# Patient Record
Sex: Female | Born: 1995 | Hispanic: No | Marital: Single | Smoking: Never smoker
Health system: Southern US, Community
[De-identification: ages and names within clinical notes are randomized; demographics above are authoritative.]

## PROBLEM LIST (undated history)

## (undated) HISTORY — PX: WISDOM TOOTH EXTRACTION: SHX21

---
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
## 2017-02-16 DIAGNOSIS — J069 Acute upper respiratory infection, unspecified: Secondary | ICD-10-CM | POA: Insufficient documentation

## 2017-02-16 DIAGNOSIS — R067 Sneezing: Secondary | ICD-10-CM | POA: Insufficient documentation

## 2017-02-16 DIAGNOSIS — R0981 Nasal congestion: Secondary | ICD-10-CM | POA: Insufficient documentation

## 2017-02-16 DIAGNOSIS — B9789 Other viral agents as the cause of diseases classified elsewhere: Secondary | ICD-10-CM | POA: Insufficient documentation

## 2017-02-16 DIAGNOSIS — J3489 Other specified disorders of nose and nasal sinuses: Secondary | ICD-10-CM | POA: Insufficient documentation

## 2017-02-16 DIAGNOSIS — J029 Acute pharyngitis, unspecified: Secondary | ICD-10-CM | POA: Insufficient documentation

## 2017-02-16 DIAGNOSIS — Z79899 Other long term (current) drug therapy: Secondary | ICD-10-CM | POA: Insufficient documentation

## 2017-02-17 ENCOUNTER — Emergency Department (HOSPITAL_COMMUNITY)
Admission: EM | Admit: 2017-02-17 | Discharge: 2017-02-17 | Disposition: A | Discharge status: Home / Self Care | Payer: Self-pay | Attending: Emergency Medicine | Admitting: Emergency Medicine

## 2017-02-17 ENCOUNTER — Encounter (HOSPITAL_COMMUNITY): Payer: Self-pay | Admitting: *Deleted

## 2017-02-17 ENCOUNTER — Other Ambulatory Visit: Payer: Self-pay

## 2017-02-17 ENCOUNTER — Emergency Department (HOSPITAL_COMMUNITY): Payer: Self-pay

## 2017-02-17 DIAGNOSIS — J069 Acute upper respiratory infection, unspecified: Secondary | ICD-10-CM

## 2017-02-17 DIAGNOSIS — B9789 Other viral agents as the cause of diseases classified elsewhere: Secondary | ICD-10-CM

## 2017-02-17 NOTE — ED Triage Notes (Signed)
Pt c/o nasal congestion with some wheezing and sob that started x 2 days ago;

## 2017-02-17 NOTE — ED Provider Notes (Signed)
[facility] EMERGENCY DEPARTMENT Provider Note   CSN: [id number] Arrival date & time: 02/16/17  2345     History   Chief Complaint Chief Complaint  Patient presents with  . URI    HPI Sophia Robinson is a 22 y.o. female.  The history is provided by the patient.  URI   This is a new problem. The current episode started 2 days ago. The problem has been gradually worsening. There has been no fever. Associated symptoms include congestion, sinus pain, sneezing, sore throat and cough. Pertinent negatives include no chest pain and no vomiting. Treatments tried: OTC meds. The treatment provided no relief.    History reviewed. No pertinent past medical history.  Patient Active Problem List   Diagnosis Date Noted  . Borderline blood pressure 05/31/2016  . Encounter for initial prescription of contraceptive pills 05/31/2016    Past Surgical History:  Procedure Laterality Date  . WISDOM TOOTH EXTRACTION      OB History    No data available       Home Medications    Prior to Admission medications   Medication Sig Start Date End Date Taking? Authorizing Provider  Multiple Vitamin (MULTIVITAMIN WITH MINERALS) TABS tablet Take 1 tablet by mouth daily.    [provider]  Norethindrone-Ethinyl Estradiol-Fe Biphas (LO LOESTRIN FE) 1 MG-10 MCG / 10 MCG tablet Take 1 tablet by mouth daily. 11/15/16   Jacklyn Shell, CNM    Family History Family History  Problem Relation Age of Onset  . Bipolar disorder Maternal Grandmother   . Cataracts Maternal Grandmother   . Cataracts Maternal Grandfather   . Diabetes Father   . Breast cancer Cousin     Social History Social History   Tobacco Use  . Smoking status: Never Smoker  . Smokeless tobacco: Never Used  Substance Use Topics  . Alcohol use: No  . Drug use: No     Allergies   Patient has no known allergies.   Review of Systems Review of Systems  HENT: Positive for congestion, sinus pain,  sneezing and sore throat.   Respiratory: Positive for cough.   Cardiovascular: Negative for chest pain.  Gastrointestinal: Negative for vomiting.  All other systems reviewed and are negative.    Physical Exam Updated Vital Signs BP 121/74 (BP Location: Right Arm)   Pulse 74   Temp 98.9 F (37.2 C) (Oral)   Resp 17   Ht 1.753 m (5\' 9" )   Wt 61.2 kg (135 lb)   LMP 02/12/2017   SpO2 100%   BMI 19.94 kg/m   Physical Exam CONSTITUTIONAL: Well developed/well nourished HEAD: Normocephalic/atraumatic EYES: EOMI ENMT: Mucous membranes moist, nasal congestion noted NECK: supple no meningeal signs SPINE/BACK:entire spine nontender CV: S1/S2 noted, no murmurs/rubs/gallops noted LUNGS: Lungs are clear to auscultation bilaterally, no apparent distress ABDOMEN: soft, nontender NEURO: Pt is awake/alert/appropriate, moves all extremitiesx4.  No facial droop.   EXTREMITIES: pulses normal/equal, full ROM SKIN: warm, color normal PSYCH: no abnormalities of mood noted, alert and oriented to situation   ED Treatments / Results  Labs (all labs ordered are listed, but only abnormal results are displayed) Labs Reviewed - No data to display  EKG  EKG Interpretation None       Radiology Dg Chest 2 View  Result Date: 02/17/2017 CLINICAL DATA:  Nonproductive cough, wheezing and shortness of breath for 2 days. EXAM: CHEST  2 VIEW COMPARISON:  None. FINDINGS: The heart size and mediastinal contours are within normal limits.  Both lungs are clear. The visualized skeletal structures are unremarkable. IMPRESSION: Normal chest. Electronically Signed   By: Awilda Metroourtnay  Bloomer M.D.   On: 02/17/2017 01:01    Procedures Procedures (including critical care time)  Medications Ordered in ED Medications - No data to display   Initial Impression / Assessment and Plan / ED Course  I have reviewed the triage vital signs and the nursing notes.  Pertinent imaging results that were available during my  care of the patient were reviewed by me and considered in my medical decision making (see chart for details).       Final Clinical Impressions(s) / ED Diagnoses   Final diagnoses:  Viral URI with cough    ED Discharge Orders    None       Zadie RhineWickline, Rastus Borton, MD 02/17/17 [phone]

## 2017-07-29 ENCOUNTER — Encounter: Payer: Self-pay | Admitting: Advanced Practice Midwife

## 2017-07-30 ENCOUNTER — Encounter: Payer: Self-pay | Admitting: Advanced Practice Midwife

## 2019-02-20 DIAGNOSIS — Z113 Encounter for screening for infections with a predominantly sexual mode of transmission: Secondary | ICD-10-CM | POA: Diagnosis not present

## 2019-02-20 DIAGNOSIS — Z6827 Body mass index (BMI) 27.0-27.9, adult: Secondary | ICD-10-CM | POA: Diagnosis not present

## 2019-02-20 DIAGNOSIS — Z01419 Encounter for gynecological examination (general) (routine) without abnormal findings: Secondary | ICD-10-CM | POA: Diagnosis not present

## 2019-03-20 DIAGNOSIS — F331 Major depressive disorder, recurrent, moderate: Secondary | ICD-10-CM | POA: Diagnosis not present

## 2019-03-20 DIAGNOSIS — Z1331 Encounter for screening for depression: Secondary | ICD-10-CM | POA: Diagnosis not present

## 2019-03-20 DIAGNOSIS — R030 Elevated blood-pressure reading, without diagnosis of hypertension: Secondary | ICD-10-CM | POA: Diagnosis not present

## 2019-03-20 DIAGNOSIS — R222 Localized swelling, mass and lump, trunk: Secondary | ICD-10-CM | POA: Diagnosis not present

## 2019-04-03 DIAGNOSIS — F4323 Adjustment disorder with mixed anxiety and depressed mood: Secondary | ICD-10-CM | POA: Diagnosis not present

## 2019-04-07 ENCOUNTER — Ambulatory Visit: Payer: Self-pay | Admitting: Surgery

## 2019-04-07 DIAGNOSIS — L723 Sebaceous cyst: Secondary | ICD-10-CM | POA: Diagnosis not present

## 2019-04-07 NOTE — H&P (Signed)
History of Present Illness Sophia Robinson. Sophia Heinemann MD; 04/07/2019 10:09 AM) The patient is a 24 year old female who presents with a complaint of Mass. This is a patient referred by Dr. Cristie Hem at [facility] Reason for consultation is a mass on the lower back This is a healthy 24 year old female who presents with several months of a palpable mass in the left lumbar region and the subcutaneous tissues. Recently became quite large and inflamed. It was very tender. She denies any rupture or drainage from this area. This slowly resolved over time. She still has a tiny bit of tenderness and it still remains easily palpable. However the pain is much better. She is referred to [place] to discuss excision.   Problem List/Past Medical Sophia Key K. Garlan Drewes, MD; 04/07/2019 10:09 AM) Sophia Robinson CYST (L72.3)  Past Surgical History Sophia Lorenzo, LPN; 04/15/7671 4:19 AM) Oral Surgery  Diagnostic Studies History Sophia Lorenzo, LPN; 04/13/9022 0:97 AM) Colonoscopy never Mammogram never Pap Smear 1-5 years ago  Allergies Sophia Lorenzo, LPN; 04/10/3297 2:42 AM) No Known Drug Allergies [04/07/2019]:  Medication History Sophia Lorenzo, LPN; 07/14/3417 6:22 AM) Sertraline HCl (100MG  Tablet, Oral) Active. Multivitamin (Oral) Active. Medications Reconciled  Social History Sophia Lorenzo, LPN; 03/18/7987 2:11 AM) Alcohol use Occasional alcohol use. Caffeine use Tea. No drug use Tobacco use Never smoker.  Family History Sophia Lorenzo, LPN; 10/11/1738 8:14 AM) Family history unknown First Degree Relatives  Pregnancy / Birth History Sophia Lorenzo, LPN; 05/15/1852 3:14 AM) Age at menarche 49 years. Contraceptive History Oral contraceptives. Gravida 0 Para 0 Regular periods  Other Problems Sophia Key K. Abeer Iversen, MD; 04/07/2019 10:09 AM) Anxiety Disorder Depression High blood pressure     Review of Systems Sophia Billings Dockery LPN; 10/13/261 7:85 AM) General Not Present- Appetite Loss,  Chills, Fatigue, Fever, Night Sweats, Weight Gain and Weight Loss. Skin Present- New Lesions. Not Present- Change in Wart/Mole, Dryness, Hives, Jaundice, Non-Healing Wounds, Rash and Ulcer. HEENT Not Present- Earache, Hearing Loss, Hoarseness, Nose Bleed, Oral Ulcers, Ringing in the Ears, Seasonal Allergies, Sinus Pain, Sore Throat, Visual Disturbances, Wears glasses/contact lenses and Yellow Eyes. Respiratory Not Present- Bloody sputum, Chronic Cough, Difficulty Breathing, Snoring and Wheezing. Breast Not Present- Breast Mass, Breast Pain, Nipple Discharge and Skin Changes. Cardiovascular Not Present- Chest Pain, Difficulty Breathing Lying Down, Leg Cramps, Palpitations, Rapid Heart Rate, Shortness of Breath and Swelling of Extremities. Gastrointestinal Not Present- Abdominal Pain, Bloating, Bloody Stool, Change in Bowel Habits, Chronic diarrhea, Constipation, Difficulty Swallowing, Excessive gas, Gets full quickly at meals, Hemorrhoids, Indigestion, Nausea, Rectal Pain and Vomiting. Female Genitourinary Not Present- Frequency, Nocturia, Painful Urination, Pelvic Pain and Urgency. Musculoskeletal Not Present- Back Pain, Joint Pain, Joint Stiffness, Muscle Pain, Muscle Weakness and Swelling of Extremities. Neurological Not Present- Decreased Memory, Fainting, Headaches, Numbness, Seizures, Tingling, Tremor, Trouble walking and Weakness. Psychiatric Present- Anxiety, Change in Sleep Pattern and Depression. Not Present- Bipolar, Fearful and Frequent crying. Endocrine Not Present- Cold Intolerance, Excessive Hunger, Hair Changes, Heat Intolerance, Hot flashes and New Diabetes. Hematology Not Present- Blood Thinners, Easy Bruising, Excessive bleeding, Gland problems, HIV and Persistent Infections.  Vitals Sophia Billings Dockery LPN; 09/13/5025 7:41 AM) 04/07/2019 9:47 AM Weight: 172.2 lb Height: 68in Body Surface Area: 1.92 m Body Mass Index: 26.18 kg/m  Temp.: 98.48F(Thermal Scan)  Pulse: 81  (Regular)  BP: 118/70 (Sitting, Left Arm, Standard)        Physical Exam Sophia Key K. Gaston Dase MD; 04/07/2019 10:12 AM)  The physical exam findings are as follows: Note:Constitutional: WDWN in NAD, conversant, no  obvious deformities; lying in bed comfortably Eyes: Pupils equal, round; sclera anicteric; moist conjunctiva; no lid lag HENT: Oral mucosa moist; good dentition Neck: No masses palpated, trachea midline; no thyromegaly Lungs: CTA bilaterally; normal respiratory effort CV: Regular rate and rhythm; no murmurs; extremities well-perfused with no edema Abd: +bowel sounds, soft, non-tender, no palpable organomegaly; no palpable hernias Musc: Unable to assess gait; no apparent clubbing or cyanosis in extremities Lymphatic: No palpable cervical or axillary lymphadenopathy Skin: Warm, dry; no sign of jaundice, left lumbar region reveals a firm palpable subcutaneous mass measuring about 2.5 cm in diameter. It is not fixed to the underlying muscle. No skin changes. No drainage from this area.  Just above the palpable mass on either side of the midline and the patient has to embedded jewel implants. She states that these have to be surgically removed. Psychiatric - alert and oriented x 4; calm mood and affect    Assessment & Plan Sophia Hazard K. Diangelo Radel MD; 04/07/2019 10:00 AM)  SEBACEOUS CYST (L72.3) Impression: Left lower back. 2.5 cm diameter  Current Plans Schedule for Surgery - excision of sebaceous cyst left lower back. The surgical procedure has been discussed with the patient. Potential risks, benefits, alternative treatments, and expected outcomes have been explained. All of the patient's questions at this time have been answered. The likelihood of reaching the patient's treatment goal is good. The patient understand the proposed surgical procedure and wishes to proceed.

## 2019-04-18 DIAGNOSIS — F331 Major depressive disorder, recurrent, moderate: Secondary | ICD-10-CM | POA: Diagnosis not present

## 2019-04-18 DIAGNOSIS — G4700 Insomnia, unspecified: Secondary | ICD-10-CM | POA: Diagnosis not present

## 2019-04-18 DIAGNOSIS — F4323 Adjustment disorder with mixed anxiety and depressed mood: Secondary | ICD-10-CM | POA: Diagnosis not present

## 2019-04-24 DIAGNOSIS — F4323 Adjustment disorder with mixed anxiety and depressed mood: Secondary | ICD-10-CM | POA: Diagnosis not present

## 2019-05-17 ENCOUNTER — Ambulatory Visit
Admission: EM | Admit: 2019-05-17 | Discharge: 2019-05-17 | Disposition: A | Discharge status: Home / Self Care | Payer: BC Managed Care – PPO

## 2019-05-17 ENCOUNTER — Other Ambulatory Visit: Payer: Self-pay

## 2019-05-17 DIAGNOSIS — K5901 Slow transit constipation: Secondary | ICD-10-CM | POA: Diagnosis not present

## 2019-05-17 DIAGNOSIS — O219 Vomiting of pregnancy, unspecified: Secondary | ICD-10-CM | POA: Diagnosis not present

## 2019-05-17 MED ORDER — MECLIZINE HCL 12.5 MG PO TABS: 12.5000 mg | ORAL_TABLET | Freq: Three times a day (TID) | ORAL | 0 refills | Status: DC | PRN

## 2019-05-17 MED ORDER — SENNOSIDES 8.6 MG PO TABS: 1.0000 | ORAL_TABLET | Freq: Every day | ORAL | 0 refills | Status: AC

## 2019-05-17 NOTE — ED Provider Notes (Signed)
[facility] CARE [facility]ENTER   [id number] 05/17/19 Arrival Time: 1020  CC: ABDOMINAL DISCOMFORT  SUBJECTIVE:  BRYNDLE CORREDOR is a 24 y.o. female who who is expecting presents to the urgent care with a complaint of nausea, vomiting and constipation for the past 2 days.  Report she ate a hot dog on Thursday night and developed the symptoms thereafter.  Vomiting is currently resolved.  Denies a precipitating event, trauma, close contacts with similar symptoms, recent travel or antibiotic use.  Has OTC medication with mild relief.  Denies alleviating or aggravating factors.  Reports/ denies similar symptoms in the past.  Last BM 05/17/19.  Denies fever, chills, appetite changes, weight changes,  chest pain, SOB, diarrhea, constipation, hematochezia, melena, dysuria, difficulty urinating, increased frequency or urgency, flank pain, loss of bowel or bladder function, vaginal discharge, vaginal odor, vaginal bleeding, dyspareunia, pelvic pain.     No LMP recorded. Patient is pregnant.  ROS: As per HPI.  All other pertinent ROS negative.     History reviewed. No pertinent past medical history. Past Surgical History:  Procedure Laterality Date  . WISDOM TOOTH EXTRACTION     No Known Allergies No current facility-administered medications on file prior to encounter.   Current Outpatient Medications on File Prior to Encounter  Medication Sig Dispense Refill  . Multiple Vitamin (MULTIVITAMIN WITH MINERALS) TABS tablet Take 1 tablet by mouth daily.    . Norethindrone-Ethinyl Estradiol-Fe Biphas (LO LOESTRIN FE) 1 MG-10 MCG / 10 MCG tablet Take 1 tablet by mouth daily. 1 Package 11  . sertraline (ZOLOFT) 100 MG tablet Take 100 mg by mouth daily.    . traZODone (DESYREL) 100 MG tablet Take 50-100 mg by mouth at bedtime as needed.     Social History   Socioeconomic History  . Marital status: Single    Spouse name: Not on file  . Number of children: Not on file  . Years of education: Not on file  .  Highest education level: Not on file  Occupational History  . Not on file  Tobacco Use  . Smoking status: Never Smoker  . Smokeless tobacco: Never Used  Substance and Sexual Activity  . Alcohol use: No  . Drug use: No  . Sexual activity: Not Currently    Birth control/protection: None  Other Topics Concern  . Not on file  Social History Narrative  . Not on file   Social Determinants of Health   Financial Resource Strain:   . Difficulty of Paying Living Expenses:   Food Insecurity:   . Worried About Programme researcher, broadcasting/film/video in the Last Year:   . Barista in the Last Year:   Transportation Needs:   . Freight forwarder (Medical):   Marland Kitchen Lack of Transportation (Non-Medical):   Physical Activity:   . Days of Exercise per Week:   . Minutes of Exercise per Session:   Stress:   . Feeling of Stress :   Social Connections:   . Frequency of Communication with Friends and Family:   . Frequency of Social Gatherings with Friends and Family:   . Attends Religious Services:   . Active Member of Clubs or Organizations:   . Attends Banker Meetings:   Marland Kitchen Marital Status:   Intimate Partner Violence:   . Fear of Current or Ex-Partner:   . Emotionally Abused:   Marland Kitchen Physically Abused:   . Sexually Abused:    Family History  Problem Relation Age of Onset  .  Bipolar disorder Maternal Grandmother   . Cataracts Maternal Grandmother   . Cataracts Maternal Grandfather   . Diabetes Father   . Breast cancer Cousin      OBJECTIVE:  Vitals:   05/17/19 1031  BP: (!) 143/77  Pulse: 83  Resp: 20  Temp: 98 F (36.7 C)  SpO2: 98%    General appearance: Alert; NAD HEENT: NCAT.  Oropharynx clear.  Lungs: clear to auscultation bilaterally without adventitious breath sounds Heart: regular rate and rhythm.  Radial pulses 2+ symmetrical bilaterally Abdomen: soft, non-distended; normal active bowel sounds; non-tender to light and deep palpation; nontender at McBurney's point;  negative Murphy's sign; negative rebound; no guarding Back: no CVA tenderness Extremities: no edema; symmetrical with no gross deformities Skin: warm and dry Neurologic: normal gait Psychological: alert and cooperative; normal mood and affect  LABS: No results found for this or any previous visit (from the past 24 hour(s)).  DIAGNOSTIC STUDIES: No results found.   ASSESSMENT & PLAN:  1. Nausea and vomiting during pregnancy   2. Slow transit constipation     Meds ordered this encounter  Medications  . meclizine (ANTIVERT) 12.5 MG tablet    Sig: Take 1 tablet (12.5 mg total) by mouth 3 (three) times daily as needed for dizziness.    Dispense:  30 tablet    Refill:  0  . senna (SENOKOT) 8.6 MG tablet    Sig: Take 1 tablet (8.6 mg total) by mouth daily.    Dispense:  30 tablet    Refill:  0     Get rest and drink fluids Antivert prescribed.  Take as directed.   Saline Prescribed for constipation take as directed   DIET Instructions:  30 minutes after taking nausea medicine, begin with sips of clear liquids. If able to hold down 2 - 4 ounces for 30 minutes, begin drinking more. Increase your fluid intake to replace losses. Clear liquids only for 24 hours (water, tea, sport drinks, clear flat ginger ale or cola and juices, broth, jello, popsicles, ect). Advance to bland foods, applesauce, rice, baked or boiled chicken, ect. Avoid milk, greasy foods and anything that doesn't agree with you.  If you experience new or worsening symptoms return or go to ER such as fever, chills, nausea, vomiting, diarrhea, bloody or dark tarry stools, constipation, urinary symptoms, worsening abdominal discomfort, symptoms that do not improve with medications, inability to keep fluids down, etc...  Reviewed expectations re: course of current medical issues. Questions answered. Outlined signs and symptoms indicating need for more acute intervention. Patient verbalized understanding. After Visit  Summary given.   Emerson Monte, FNP 05/17/19 1108

## 2019-05-17 NOTE — Discharge Instructions (Addendum)
30 minutes after taking nausea medicine, begin with sips of clear liquids. If able to hold down 2 - 4 ounces for 30 minutes, begin drinking more. Increase your fluid intake to replace losses. Increase fluid intake as tolerated Advance to bland foods, applesauce, rice, baked or boiled chicken, ect. Avoid milk, greasy foods and anything that doesnt agree with you.

## 2019-05-17 NOTE — ED Triage Notes (Signed)
Pt states that she had hot dogs on Thursday night and then begin vomiting Friday morning, pt states vomiting has resolved but still nauseated and is constipated

## 2019-05-22 DIAGNOSIS — F4323 Adjustment disorder with mixed anxiety and depressed mood: Secondary | ICD-10-CM | POA: Diagnosis not present

## 2019-06-25 DIAGNOSIS — L720 Epidermal cyst: Secondary | ICD-10-CM | POA: Diagnosis not present

## 2019-06-25 DIAGNOSIS — L723 Sebaceous cyst: Secondary | ICD-10-CM | POA: Diagnosis not present

## 2019-08-22 DIAGNOSIS — Z0000 Encounter for general adult medical examination without abnormal findings: Secondary | ICD-10-CM | POA: Diagnosis not present

## 2019-08-28 DIAGNOSIS — Z1331 Encounter for screening for depression: Secondary | ICD-10-CM | POA: Diagnosis not present

## 2019-08-28 DIAGNOSIS — Z0000 Encounter for general adult medical examination without abnormal findings: Secondary | ICD-10-CM | POA: Diagnosis not present

## 2019-09-29 DIAGNOSIS — Z3041 Encounter for surveillance of contraceptive pills: Secondary | ICD-10-CM | POA: Diagnosis not present

## 2019-09-29 DIAGNOSIS — Z113 Encounter for screening for infections with a predominantly sexual mode of transmission: Secondary | ICD-10-CM | POA: Diagnosis not present

## 2019-10-01 ENCOUNTER — Other Ambulatory Visit: Payer: Self-pay

## 2019-10-01 ENCOUNTER — Ambulatory Visit
Admission: EM | Admit: 2019-10-01 | Discharge: 2019-10-01 | Disposition: A | Discharge status: Home / Self Care | Payer: BC Managed Care – PPO | Attending: Emergency Medicine | Admitting: Emergency Medicine

## 2019-10-01 DIAGNOSIS — Z20822 Contact with and (suspected) exposure to covid-19: Secondary | ICD-10-CM

## 2019-10-01 DIAGNOSIS — J069 Acute upper respiratory infection, unspecified: Secondary | ICD-10-CM

## 2019-10-01 MED ORDER — CETIRIZINE HCL 10 MG PO TABS: 10.0000 mg | ORAL_TABLET | Freq: Every day | ORAL | 0 refills | Status: AC

## 2019-10-01 MED ORDER — FLUTICASONE PROPIONATE 50 MCG/ACT NA SUSP: 2.0000 | Freq: Every day | NASAL | 0 refills | Status: DC

## 2019-10-01 MED ORDER — BENZONATATE 100 MG PO CAPS: 100.0000 mg | ORAL_CAPSULE | Freq: Three times a day (TID) | ORAL | 0 refills | Status: DC

## 2019-10-01 NOTE — Discharge Instructions (Signed)
COVID testing ordered.  It will take between 2-5 days for test results.  Someone will contact you regarding abnormal results.    In the meantime: You should remain isolated in your home for 10 days from symptom onset AND greater than 72 hours after symptoms resolution (absence of fever without the use of fever-reducing medication and improvement in respiratory symptoms), whichever is longer Get plenty of rest and push fluids Tessalon Perles prescribed for cough zyrtec for nasal congestion, runny nose, and/or sore throat flonase for nasal congestion and runny nose Use medications daily for symptom relief Use OTC medications like ibuprofen or tylenol as needed fever or pain Call or go to the ED if you have any new or worsening symptoms such as fever, worsening cough, shortness of breath, chest tightness, chest pain, turning blue, changes in mental status, etc...  

## 2019-10-01 NOTE — ED Triage Notes (Signed)
Pt presents with cough that developed after covid exposure

## 2019-10-01 NOTE — ED Provider Notes (Signed)
[facility]   [id number] 10/01/19 Arrival Time: 3976   CC: COVID symptoms  SUBJECTIVE: History from: patient.  TRACIE DORE is a 24 y.o. female who presents with sinus congestion, runny nose, cough, and fever in office of 100.6 x today.  Mother diagnosed with COVID.  Last exposure x 5 days.  Has not tried OTC medications.  Denies aggravating factors.  Denies previous COVID infection.   Denies chills, SOB, wheezing, chest pain, nausea, changes in bowel or bladder habits.    ROS: As per HPI.  All other pertinent ROS negative.     History reviewed. No pertinent past medical history. Past Surgical History:  Procedure Laterality Date  . WISDOM TOOTH EXTRACTION     No Known Allergies No current facility-administered medications on file prior to encounter.   Current Outpatient Medications on File Prior to Encounter  Medication Sig Dispense Refill  . Multiple Vitamin (MULTIVITAMIN WITH MINERALS) TABS tablet Take 1 tablet by mouth daily.    . Norethindrone-Ethinyl Estradiol-Fe Biphas (LO LOESTRIN FE) 1 MG-10 MCG / 10 MCG tablet Take 1 tablet by mouth daily. 1 Package 11  . senna (SENOKOT) 8.6 MG tablet Take 1 tablet (8.6 mg total) by mouth daily. 30 tablet 0  . sertraline (ZOLOFT) 100 MG tablet Take 100 mg by mouth daily.    . traZODone (DESYREL) 100 MG tablet Take 50-100 mg by mouth at bedtime as needed.     Social History   Socioeconomic History  . Marital status: Single    Spouse name: Not on file  . Number of children: Not on file  . Years of education: Not on file  . Highest education level: Not on file  Occupational History  . Not on file  Tobacco Use  . Smoking status: Never Smoker  . Smokeless tobacco: Never Used  Substance and Sexual Activity  . Alcohol use: No  . Drug use: No  . Sexual activity: Not Currently    Birth control/protection: None  Other Topics Concern  . Not on file  Social History Narrative  . Not on file   Social Determinants of  Health   Financial Resource Strain:   . Difficulty of Paying Living Expenses: Not on file  Food Insecurity:   . Worried About Programme researcher, broadcasting/film/video in the Last Year: Not on file  . Ran Out of Food in the Last Year: Not on file  Transportation Needs:   . Lack of Transportation (Medical): Not on file  . Lack of Transportation (Non-Medical): Not on file  Physical Activity:   . Days of Exercise per Week: Not on file  . Minutes of Exercise per Session: Not on file  Stress:   . Feeling of Stress : Not on file  Social Connections:   . Frequency of Communication with Friends and Family: Not on file  . Frequency of Social Gatherings with Friends and Family: Not on file  . Attends Religious Services: Not on file  . Active Member of Clubs or Organizations: Not on file  . Attends Banker Meetings: Not on file  . Marital Status: Not on file  Intimate Partner Violence:   . Fear of Current or Ex-Partner: Not on file  . Emotionally Abused: Not on file  . Physically Abused: Not on file  . Sexually Abused: Not on file   Family History  Problem Relation Age of Onset  . Bipolar disorder Maternal Grandmother   . Cataracts Maternal Grandmother   . Cataracts Maternal  Grandfather   . Diabetes Father   . Breast cancer Cousin     OBJECTIVE:  Vitals:   10/01/19 0927  BP: (!) 155/88  Pulse: 88  Resp: 17  Temp: (!) 100.6 F (38.1 C)  TempSrc: Oral  SpO2: 98%     General appearance: alert; mildly fatigued appearing, nontoxic; speaking in full sentences and tolerating own secretions HEENT: NCAT; Ears: EACs clear, TMs pearly gray; Eyes: PERRL.  EOM grossly intact. Nose: nares patent without rhinorrhea, Throat: oropharynx clear, tonsils non erythematous or enlarged, uvula midline  Neck: supple without LAD Lungs: unlabored respirations, symmetrical air entry; cough: absent; no respiratory distress; CTAB Heart: regular rate and rhythm.  Skin: warm and dry Psychological: alert and  cooperative; normal mood and affect   ASSESSMENT & PLAN:  1. Viral URI with cough   2. Suspected COVID-19 virus infection     Meds ordered this encounter  Medications  . cetirizine (ZYRTEC) 10 MG tablet    Sig: Take 1 tablet (10 mg total) by mouth daily.    Dispense:  30 tablet    Refill:  0    Order Specific Question:   Supervising Provider    Answer:   Eustace Moore [[id number]]  . fluticasone (FLONASE) 50 MCG/ACT nasal spray    Sig: Place 2 sprays into both nostrils daily.    Dispense:  16 g    Refill:  0    Order Specific Question:   Supervising Provider    Answer:   Eustace Moore [[id number]]  . benzonatate (TESSALON) 100 MG capsule    Sig: Take 1 capsule (100 mg total) by mouth every 8 (eight) hours.    Dispense:  21 capsule    Refill:  0    Order Specific Question:   Supervising Provider    Answer:   Eustace Moore [[id number]]    COVID testing ordered.  It will take between 2-5 days for test results.  Someone will contact you regarding abnormal results.    In the meantime: You should remain isolated in your home for 10 days from symptom onset AND greater than 72 hours after symptoms resolution (absence of fever without the use of fever-reducing medication and improvement in respiratory symptoms), whichever is longer Get plenty of rest and push fluids Tessalon Perles prescribed for cough zyrtec for nasal congestion, runny nose, and/or sore throat flonase for nasal congestion and runny nose Use medications daily for symptom relief Use OTC medications like ibuprofen or tylenol as needed fever or pain Call or go to the ED if you have any new or worsening symptoms such as fever, worsening cough, shortness of breath, chest tightness, chest pain, turning blue, changes in mental status, etc...   Reviewed expectations re: course of current medical issues. Questions answered. Outlined signs and symptoms indicating need for more acute intervention. Patient verbalized  understanding. After Visit Summary given.         Alvino Chapel [place], PA-C 10/01/19 [phone]

## 2019-10-03 LAB — NOVEL CORONAVIRUS, NAA: SARS-CoV-2, NAA: DETECTED — AB

## 2019-10-03 LAB — SARS-COV-2, NAA 2 DAY TAT

## 2019-12-26 DIAGNOSIS — R61 Generalized hyperhidrosis: Secondary | ICD-10-CM | POA: Diagnosis not present

## 2019-12-26 DIAGNOSIS — F331 Major depressive disorder, recurrent, moderate: Secondary | ICD-10-CM | POA: Diagnosis not present

## 2019-12-26 DIAGNOSIS — Z7189 Other specified counseling: Secondary | ICD-10-CM | POA: Diagnosis not present

## 2020-01-06 DIAGNOSIS — Z111 Encounter for screening for respiratory tuberculosis: Secondary | ICD-10-CM | POA: Diagnosis not present

## 2020-01-22 DIAGNOSIS — G4700 Insomnia, unspecified: Secondary | ICD-10-CM | POA: Diagnosis not present

## 2020-01-22 DIAGNOSIS — R61 Generalized hyperhidrosis: Secondary | ICD-10-CM | POA: Diagnosis not present

## 2020-02-11 DIAGNOSIS — N6310 Unspecified lump in the right breast, unspecified quadrant: Secondary | ICD-10-CM | POA: Diagnosis not present

## 2020-02-11 DIAGNOSIS — R61 Generalized hyperhidrosis: Secondary | ICD-10-CM | POA: Diagnosis not present

## 2020-02-17 ENCOUNTER — Other Ambulatory Visit: Payer: Self-pay | Admitting: Internal Medicine

## 2020-02-17 DIAGNOSIS — N63 Unspecified lump in unspecified breast: Secondary | ICD-10-CM

## 2020-03-19 ENCOUNTER — Other Ambulatory Visit: Payer: Self-pay

## 2020-03-19 ENCOUNTER — Ambulatory Visit
Admission: RE | Admit: 2020-03-19 | Discharge: 2020-03-19 | Disposition: A | Discharge status: Home / Self Care | Payer: BC Managed Care – PPO | Source: Ambulatory Visit | Attending: Internal Medicine | Admitting: Internal Medicine

## 2020-03-19 DIAGNOSIS — N6489 Other specified disorders of breast: Secondary | ICD-10-CM | POA: Diagnosis not present

## 2020-03-19 DIAGNOSIS — N63 Unspecified lump in unspecified breast: Secondary | ICD-10-CM

## 2020-03-25 DIAGNOSIS — Z01419 Encounter for gynecological examination (general) (routine) without abnormal findings: Secondary | ICD-10-CM | POA: Diagnosis not present

## 2020-03-25 DIAGNOSIS — Z113 Encounter for screening for infections with a predominantly sexual mode of transmission: Secondary | ICD-10-CM | POA: Diagnosis not present

## 2020-03-25 DIAGNOSIS — Z6826 Body mass index (BMI) 26.0-26.9, adult: Secondary | ICD-10-CM | POA: Diagnosis not present

## 2020-03-27 ENCOUNTER — Ambulatory Visit
Admission: RE | Admit: 2020-03-27 | Discharge: 2020-03-27 | Disposition: A | Discharge status: Home / Self Care | Payer: BC Managed Care – PPO | Source: Ambulatory Visit | Attending: Emergency Medicine | Admitting: Emergency Medicine

## 2020-03-27 ENCOUNTER — Other Ambulatory Visit: Payer: Self-pay

## 2020-03-27 VITALS — BP 117/58 | HR 93 | Temp 100.4°F | Resp 18

## 2020-03-27 DIAGNOSIS — J069 Acute upper respiratory infection, unspecified: Secondary | ICD-10-CM

## 2020-03-27 DIAGNOSIS — Z20822 Contact with and (suspected) exposure to covid-19: Secondary | ICD-10-CM | POA: Diagnosis not present

## 2020-03-27 MED ORDER — FLUTICASONE PROPIONATE 50 MCG/ACT NA SUSP
1.0000 | Freq: Every day | NASAL | 0 refills | Status: AC
Start: 2020-03-27 — End: ?

## 2020-03-27 MED ORDER — BENZONATATE 100 MG PO CAPS: 100.0000 mg | ORAL_CAPSULE | Freq: Three times a day (TID) | ORAL | 0 refills | Status: AC

## 2020-03-27 MED ORDER — IBUPROFEN 800 MG PO TABS: 800.0000 mg | ORAL_TABLET | Freq: Three times a day (TID) | ORAL | 0 refills | Status: AC

## 2020-03-27 NOTE — Discharge Instructions (Addendum)
Covid/flu test pending Tylenol and ibuprofen for fevers, headaches, body aches Flonase for congestion Tessalon for cough May continue other over-the-counter medicine as well for cough and congestion Rest and fluids Follow-up if not improving or worsening

## 2020-03-27 NOTE — ED Provider Notes (Signed)
[facility]    CSN: [id number] Arrival date & time: 03/27/20  0948      History   Chief Complaint Chief Complaint  Patient presents with  . Appointment  . URI    HPI Sophia Robinson is a 25 y.o. female presenting today for evaluation of URI symptoms.  Reports cough congestion and sneezing over the past 2 to 3 days.  Reports associated fevers chills and body aches with night sweats over the past 1 to 2 days.  Denies any known close exposures.  Denies any shortness of breath.  HPI  History reviewed. No pertinent past medical history.  Patient Active Problem List   Diagnosis Date Noted  . Borderline blood pressure 05/31/2016  . Encounter for initial prescription of contraceptive pills 05/31/2016    Past Surgical History:  Procedure Laterality Date  . WISDOM TOOTH EXTRACTION      OB History    Gravida  1   Para      Term      Preterm      AB      Living        SAB      IAB      Ectopic      Multiple      Live Births               Home Medications    Prior to Admission medications   Medication Sig Start Date End Date Taking? Authorizing Provider  benzonatate (TESSALON) 100 MG capsule Take 1 capsule (100 mg total) by mouth every 8 (eight) hours. 03/27/20  Yes Takaya Hyslop C, PA-C  fluticasone (FLONASE) 50 MCG/ACT nasal spray Place 1-2 sprays into both nostrils daily. 03/27/20  Yes Odetta Forness C, PA-C  ibuprofen (ADVIL) 800 MG tablet Take 1 tablet (800 mg total) by mouth 3 (three) times daily. 03/27/20  Yes Derotha Fishbaugh C, PA-C  cetirizine (ZYRTEC) 10 MG tablet Take 1 tablet (10 mg total) by mouth daily. 10/01/19   Wurst, [place], PA-C  Multiple Vitamin (MULTIVITAMIN WITH MINERALS) TABS tablet Take 1 tablet by mouth daily.    [provider]  Norethindrone-Ethinyl Estradiol-Fe Biphas (LO LOESTRIN FE) 1 MG-10 MCG / 10 MCG tablet Take 1 tablet by mouth daily. 11/15/16   Cresenzo-Dishmon, Scarlette Calico, CNM  senna (SENOKOT) 8.6  MG tablet Take 1 tablet (8.6 mg total) by mouth daily. 05/17/19   Avegno, Zachery Dakins, FNP  sertraline (ZOLOFT) 100 MG tablet Take 100 mg by mouth daily. 04/18/19   [provider]  traZODone (DESYREL) 100 MG tablet Take 50-100 mg by mouth at bedtime as needed. 04/18/19   [provider]    Family History Family History  Problem Relation Age of Onset  . Bipolar disorder Maternal Grandmother   . Cataracts Maternal Grandmother   . Cataracts Maternal Grandfather   . Diabetes Father   . Breast cancer Cousin     Social History Social History   Tobacco Use  . Smoking status: Never Smoker  . Smokeless tobacco: Never Used  Substance Use Topics  . Alcohol use: No  . Drug use: No     Allergies   Patient has no known allergies.   Review of Systems Review of Systems  Constitutional: Positive for chills and fever. Negative for activity change, appetite change and fatigue.  HENT: Positive for congestion, rhinorrhea, sneezing and sore throat. Negative for ear pain, sinus pressure and trouble swallowing.   Eyes: Negative for discharge and redness.  Respiratory: Positive for cough. Negative for chest tightness and shortness of breath.   Cardiovascular: Negative for chest pain.  Gastrointestinal: Negative for abdominal pain, diarrhea, nausea and vomiting.  Musculoskeletal: Negative for myalgias.  Skin: Negative for rash.  Neurological: Positive for headaches. Negative for dizziness and light-headedness.     Physical Exam Triage Vital Signs ED Triage Vitals  Enc Vitals Group     BP      Pulse      Resp      Temp      Temp src      SpO2      Weight      Height      Head Circumference      Peak Flow      Pain Score      Pain Loc      Pain Edu?      Excl. in GC?    No data found.  Updated Vital Signs BP (!) 117/58 (BP Location: Left Arm)   Pulse 93   Temp (!) 100.4 F (38 C) (Oral)   Resp 18   SpO2 98%   Visual Acuity Right Eye Distance:   Left Eye  Distance:   Bilateral Distance:    Right Eye Near:   Left Eye Near:    Bilateral Near:     Physical Exam Vitals and nursing note reviewed.  Constitutional:      Appearance: She is well-developed and well-nourished.     Comments: No acute distress  HENT:     Head: Normocephalic and atraumatic.     Ears:     Comments: Bilateral ears without tenderness to palpation of external auricle, tragus and mastoid, EAC's without erythema or swelling, TM's with good bony landmarks and cone of light. Non erythematous.     Nose: Nose normal.     Mouth/Throat:     Comments: Oral mucosa pink and moist, no tonsillar enlargement or exudate. Posterior pharynx patent and nonerythematous, no uvula deviation or swelling. Normal phonation. Eyes:     Conjunctiva/sclera: Conjunctivae normal.  Cardiovascular:     Rate and Rhythm: Normal rate.  Pulmonary:     Effort: Pulmonary effort is normal. No respiratory distress.     Comments: Breathing comfortably at rest, CTABL, no wheezing, rales or other adventitious sounds auscultated Abdominal:     General: There is no distension.  Musculoskeletal:        General: Normal range of motion.     Cervical back: Neck supple.  Skin:    General: Skin is warm and dry.  Neurological:     Mental Status: She is alert and oriented to person, place, and time.  Psychiatric:        Mood and Affect: Mood and affect normal.      UC Treatments / Results  Labs (all labs ordered are listed, but only abnormal results are displayed) Labs Reviewed  NOVEL CORONAVIRUS, NAA  COVID-19, FLU A+B NAA    EKG   Radiology No results found.  Procedures Procedures (including critical care time)  Medications Ordered in UC Medications - No data to display  Initial Impression / Assessment and Plan / UC Course  I have reviewed the triage vital signs and the nursing notes.  Pertinent labs & imaging results that were available during my care of the patient were reviewed by me  and considered in my medical decision making (see chart for details).     Viral URI with cough-fever today in clinic, exam  reassuring, lungs clear, recommend symptomatic and supportive care, suspect viral etiology.  Monitor for gradual resolution,Discussed strict return precautions. Patient verbalized understanding and is agreeable with plan.  Final Clinical Impressions(s) / UC Diagnoses   Final diagnoses:  Encounter for screening laboratory testing for COVID-19 virus  Viral URI with cough     Discharge Instructions     Covid/flu test pending Tylenol and ibuprofen for fevers, headaches, body aches Flonase for congestion Tessalon for cough May continue other over-the-counter medicine as well for cough and congestion Rest and fluids Follow-up if not improving or worsening    ED Prescriptions    Medication Sig Dispense Auth. Provider   ibuprofen (ADVIL) 800 MG tablet Take 1 tablet (800 mg total) by mouth 3 (three) times daily. 21 tablet Marzetta Lanza C, PA-C   benzonatate (TESSALON) 100 MG capsule Take 1 capsule (100 mg total) by mouth every 8 (eight) hours. 21 capsule Eliga Arvie C, PA-C   fluticasone (FLONASE) 50 MCG/ACT nasal spray Place 1-2 sprays into both nostrils daily. 16 g Deborra Phegley, [place] C, PA-C     PDMP not reviewed this encounter.   Lew Dawes, PA-C 03/27/20 1040

## 2020-03-27 NOTE — ED Triage Notes (Signed)
Pt sts URI sx with cough x 5 days; pt sts some chills and body aches

## 2020-03-28 LAB — COVID-19, FLU A+B NAA
Influenza A, NAA: NOT DETECTED
Influenza B, NAA: NOT DETECTED
SARS-CoV-2, NAA: NOT DETECTED

## 2021-03-02 ENCOUNTER — Other Ambulatory Visit: Payer: Self-pay

## 2021-03-02 ENCOUNTER — Emergency Department (HOSPITAL_COMMUNITY): Payer: 59

## 2021-03-02 ENCOUNTER — Emergency Department (HOSPITAL_COMMUNITY)
Admission: EM | Admit: 2021-03-02 | Discharge: 2021-03-03 | Discharge status: Against Medical Advice | Payer: 59 | Attending: Emergency Medicine | Admitting: Emergency Medicine

## 2021-03-02 DIAGNOSIS — M79642 Pain in left hand: Secondary | ICD-10-CM | POA: Insufficient documentation

## 2021-03-02 DIAGNOSIS — Z5321 Procedure and treatment not carried out due to patient leaving prior to being seen by health care provider: Secondary | ICD-10-CM | POA: Diagnosis not present

## 2021-03-02 DIAGNOSIS — M79645 Pain in left finger(s): Secondary | ICD-10-CM | POA: Insufficient documentation

## 2021-03-02 NOTE — ED Provider Triage Note (Signed)
Emergency Medicine Provider Triage Evaluation Note  Sophia Robinson , a 26 y.o. female  was evaluated in triage.  Pt complains of left hand pain since a few months ago. Patient states she struck her hand a few months ago prior to pain onset. Patient states the hand has been slightly painful since this time.   Review of Systems  Positive: Left hand pain Negative: Numbness, tingling, weakness  Physical Exam  BP 138/77 (BP Location: Right Arm)    Pulse 67    Temp 98.7 F (37.1 C) (Oral)    Resp 18    Ht 5\' 9"  (1.753 m)    Wt 79.4 kg    SpO2 99%    BMI 25.84 kg/m  Gen:   Awake, no distress   Resp:  Normal effort  MSK:   Moves extremities without difficulty  Other:    Medical Decision Making  Medically screening exam initiated at 7:04 PM.  Appropriate orders placed.  Delsa Grana was informed that the remainder of the evaluation will be completed by another provider, this initial triage assessment does not replace that evaluation, and the importance of remaining in the ED until their evaluation is complete.     Azucena Cecil, PA-C 03/02/21 1905

## 2021-03-02 NOTE — ED Triage Notes (Signed)
C/O left thumb pain x 2-3 months; aching sensation; worst with certain movements. Stated injured hand months ago and pain persistent.

## 2021-03-03 NOTE — ED Notes (Signed)
Patient left on own accord °

## 2021-06-21 IMAGING — US US BREAST*R* LIMITED INC AXILLA
1 series · 5 of 5 positions shown · non-contrast
Comparison: None.

CLINICAL DATA: 24-year-old female with palpable skin thickening at
the UPPER INNER RIGHT areolar edge. The patient has been squeezing
and picking at this area recently.

EXAM:
ULTRASOUND OF THE RIGHT BREAST

[Series 1: us breast*right* limited inc axilla · 0.02mm/px · 5 of 5 slices shown]
[im 1/5]
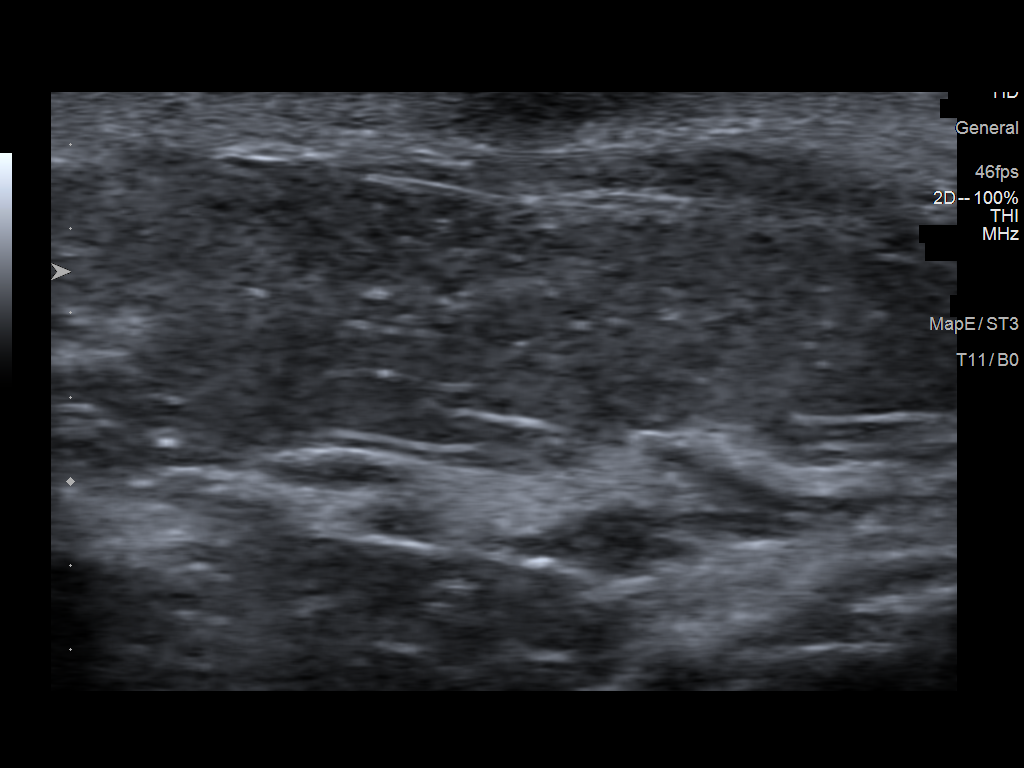
[im 2/5]
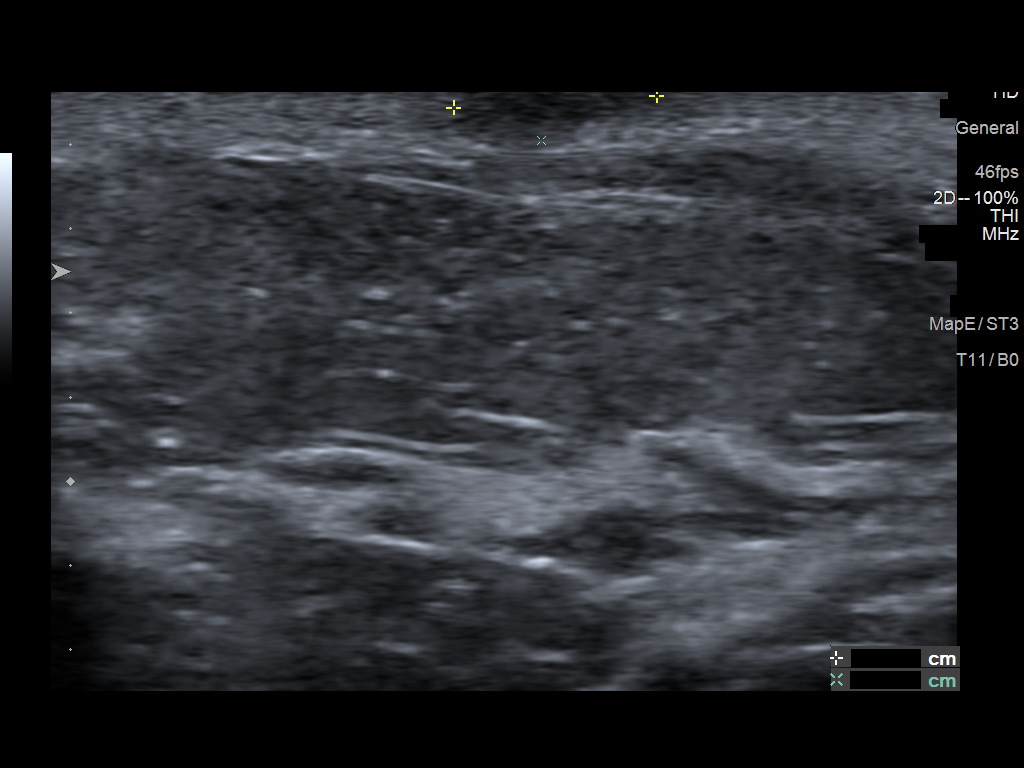
[im 3/5]
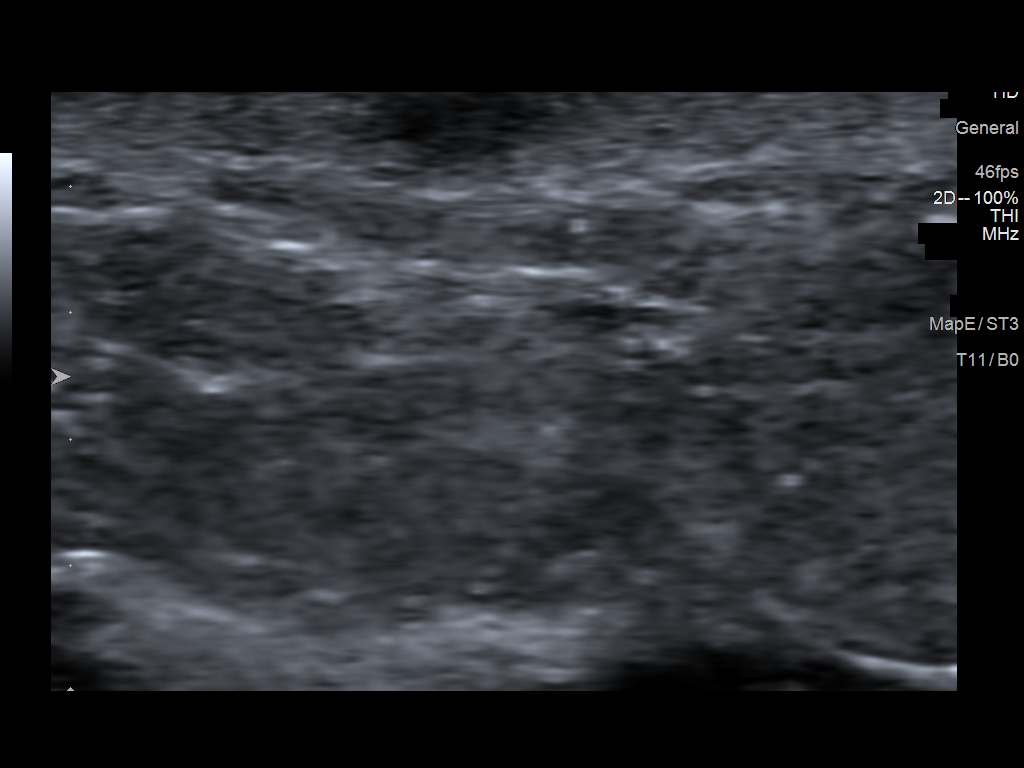
[im 4/5]
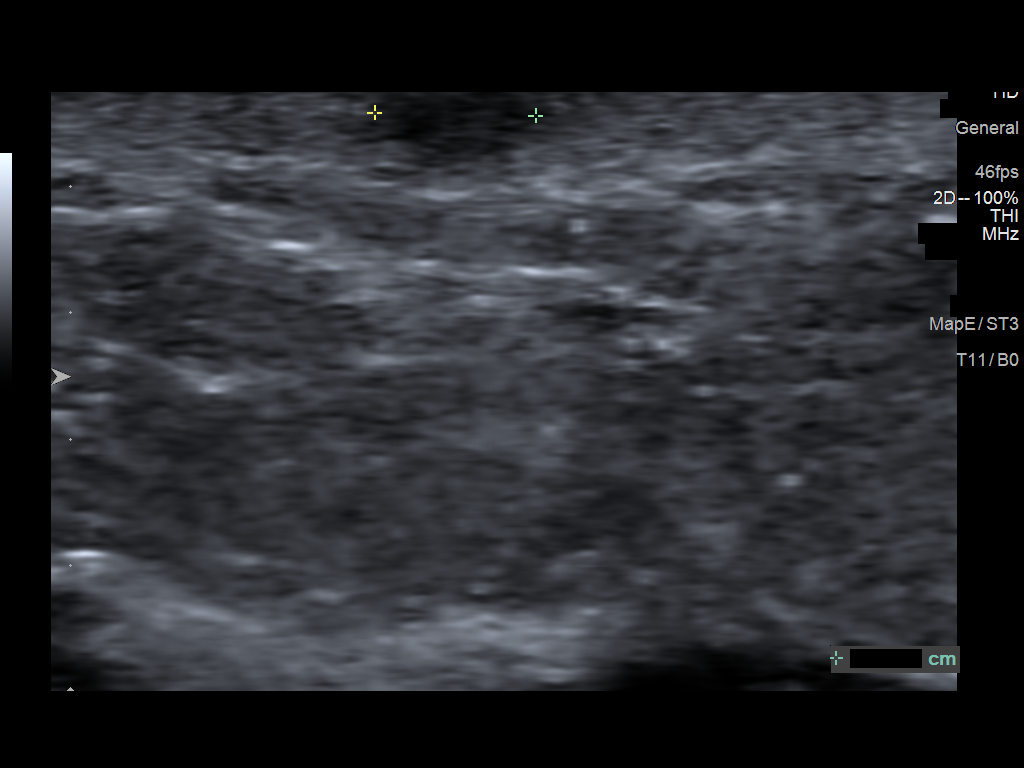
[im 5/5]
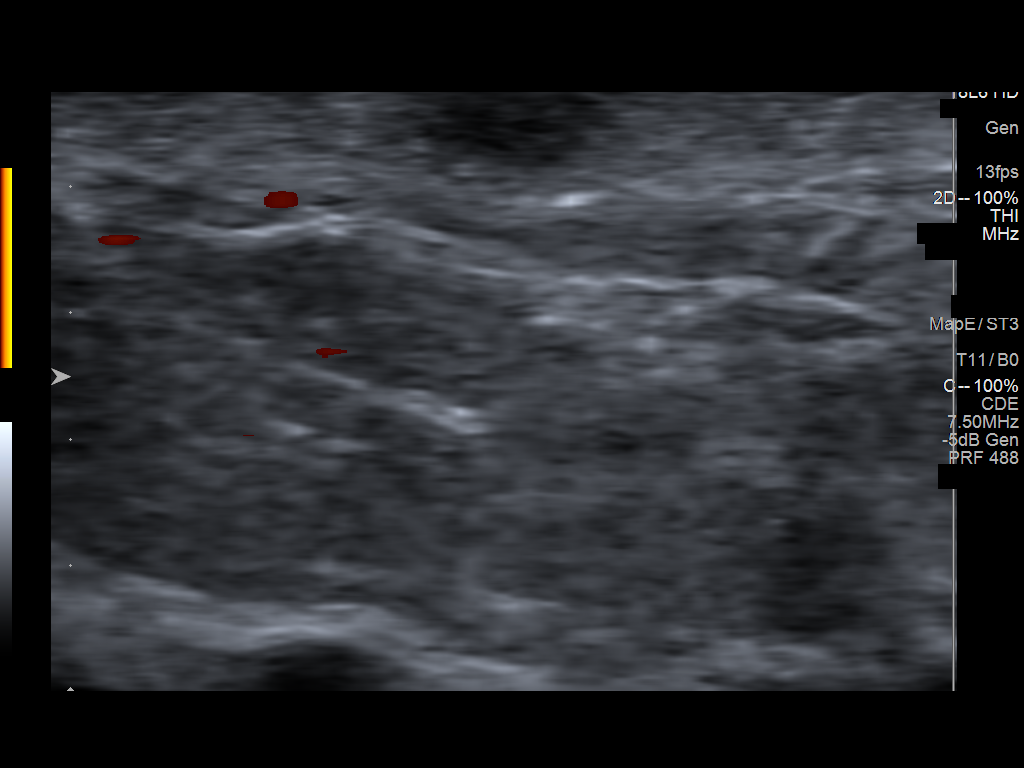

[5 of 5 positions shown; findings below may reference images not displayed]

FINDINGS: On physical exam, a small area of discoloration and focal skin
thickening is identified at the 2 o'clock position at the areolar
margin.

Targeted ultrasound is performed, showing a 0.5 x 0.2 x 0.3 cm
complicated collection within the skin at the 2 o'clock position of
the RIGHT breast at the areolar margin. This is compatible with a
benign process, possibly epidermoid/sebaceous cyst.
IMPRESSION: 0.5 cm benign skin collection within the UPPER INNER RIGHT breast at
the areolar margin. This most likely represents epidermoid/sebaceous
cyst.

RECOMMENDATION:
Consider clinical follow-up as indicated. Any further workup should
be based on clinical grounds.

I have discussed the findings and recommendations with the patient.
If applicable, a reminder letter will be sent to the patient
regarding the next appointment.

BI-RADS CATEGORY  2: Benign.

## 2022-06-04 IMAGING — DX DG HAND COMPLETE 3+V*L*
3 series · 3 of 3 positions shown · non-contrast
Comparison: None.

CLINICAL DATA: Left hand pain since an injury several months ago.

EXAM:
LEFT HAND - COMPLETE 3+ VIEW

[hand pa]
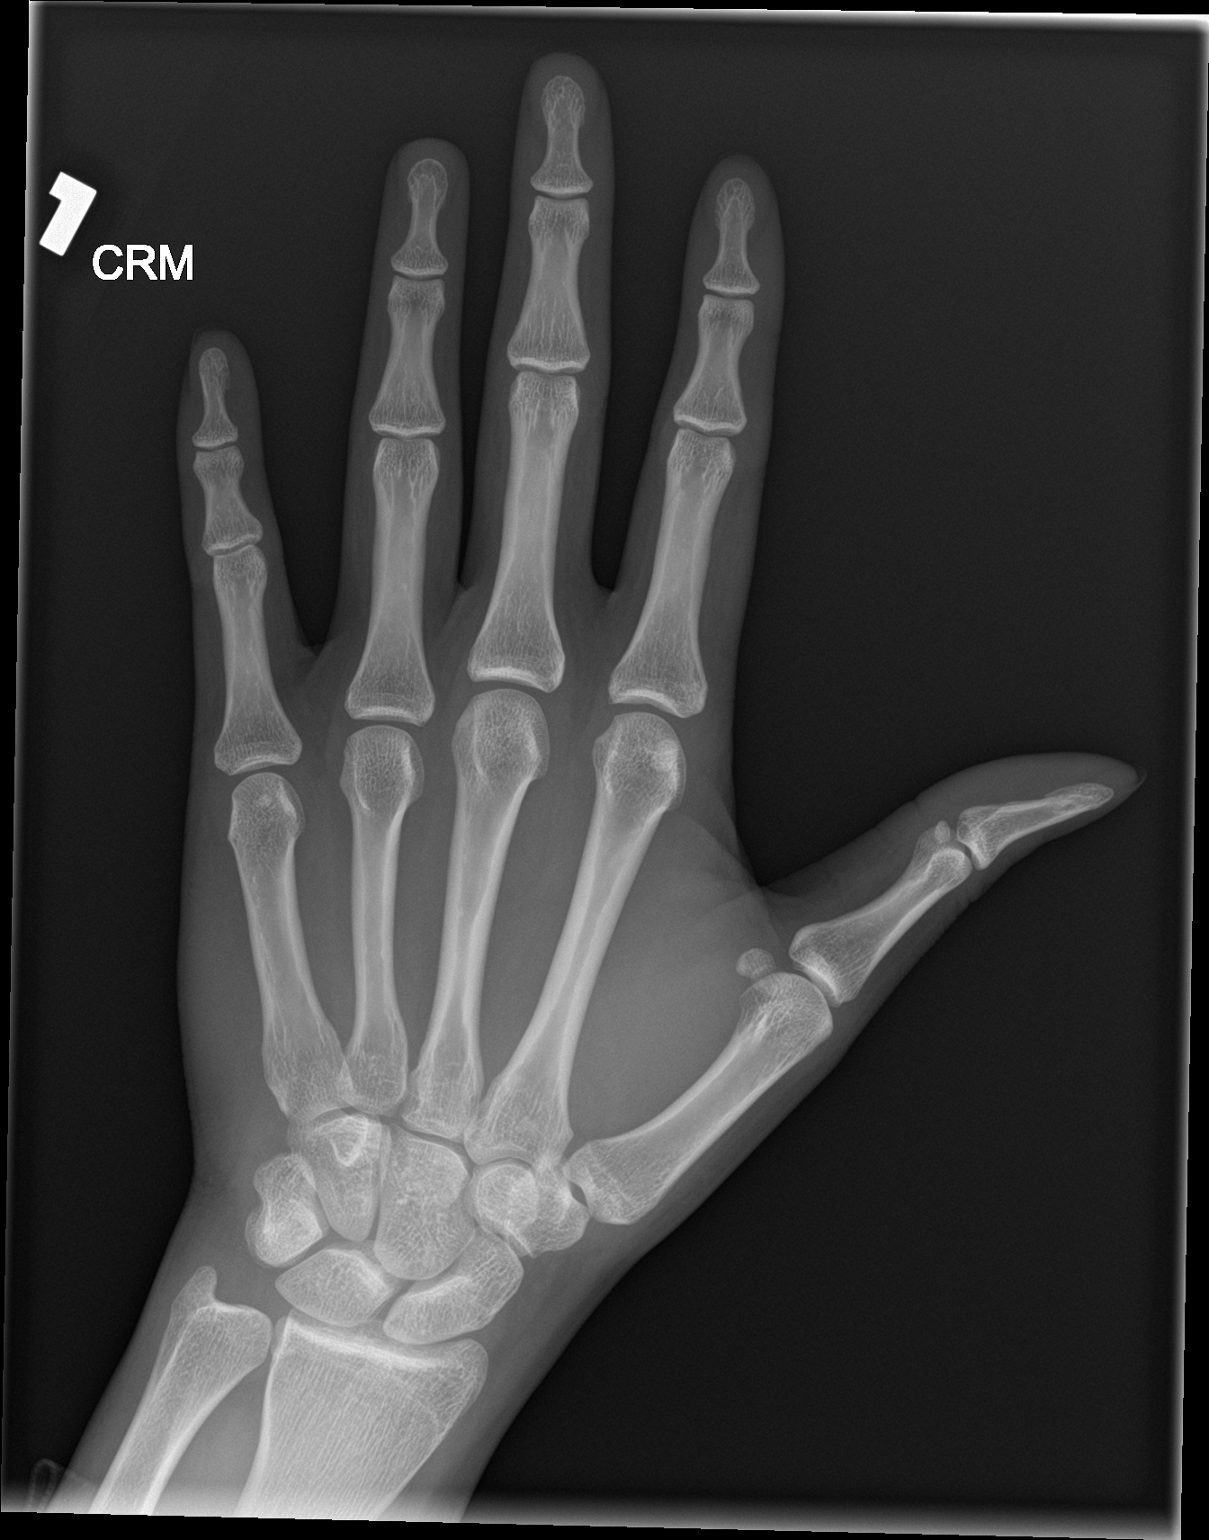

[hand obl]
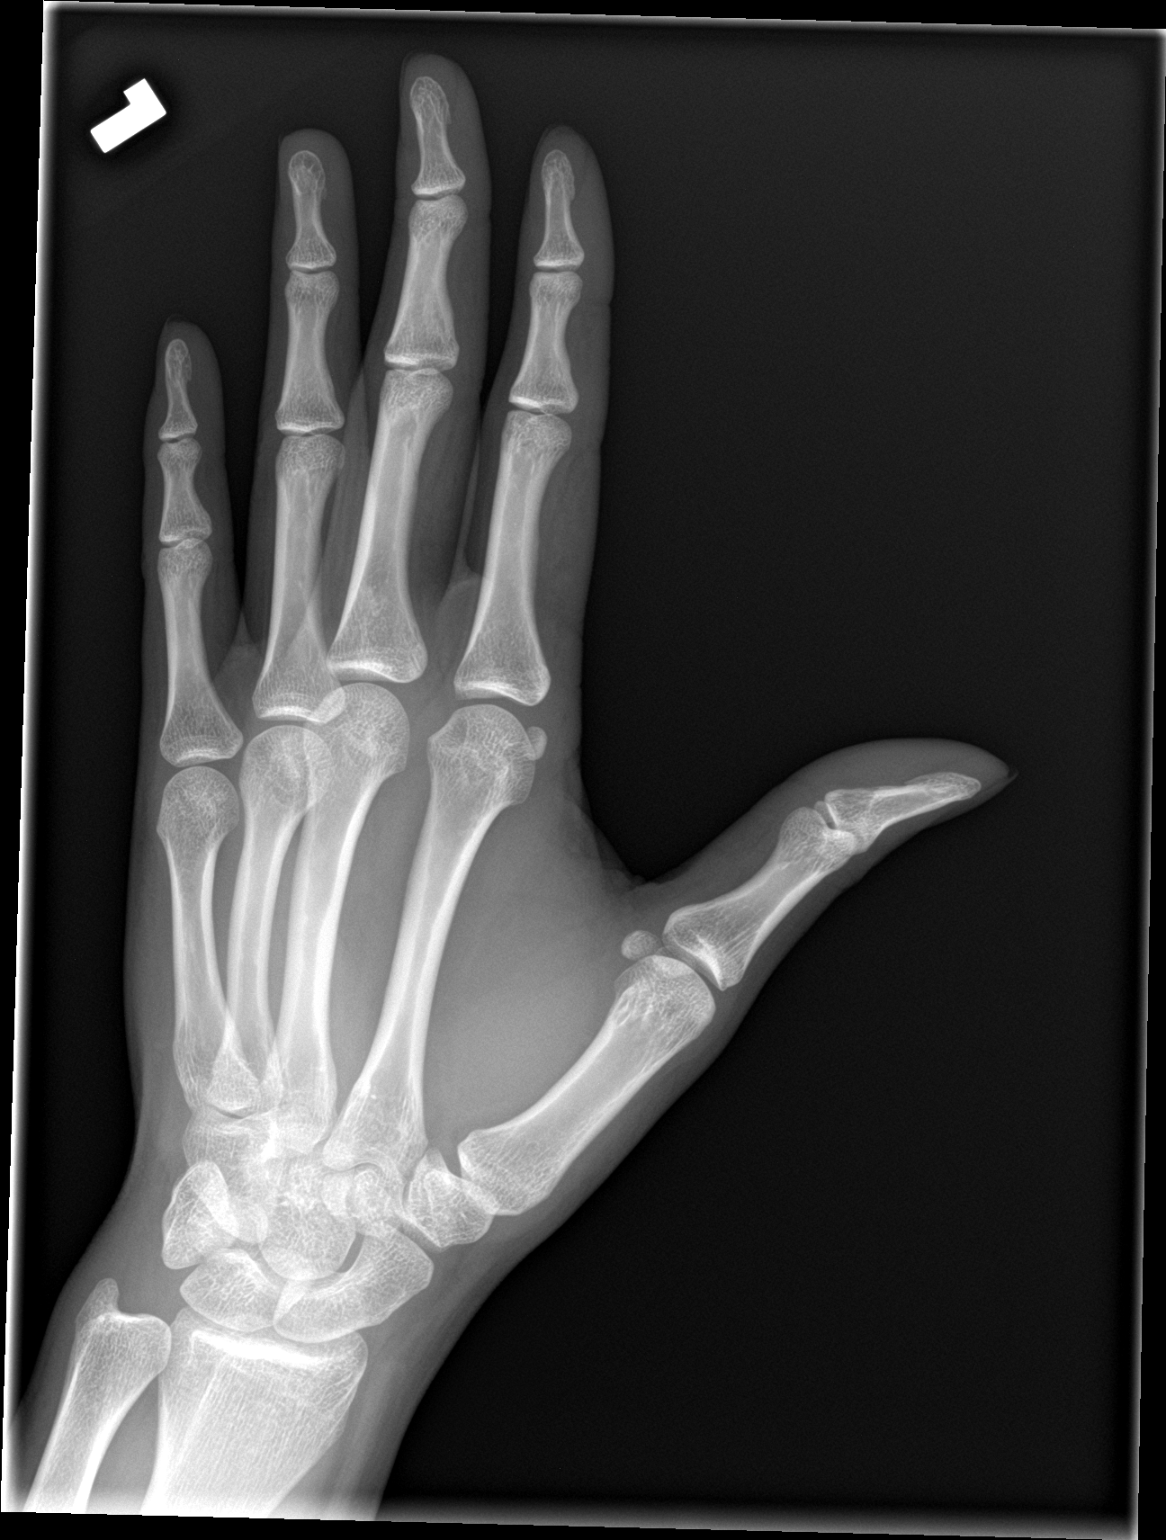

[hand lat]
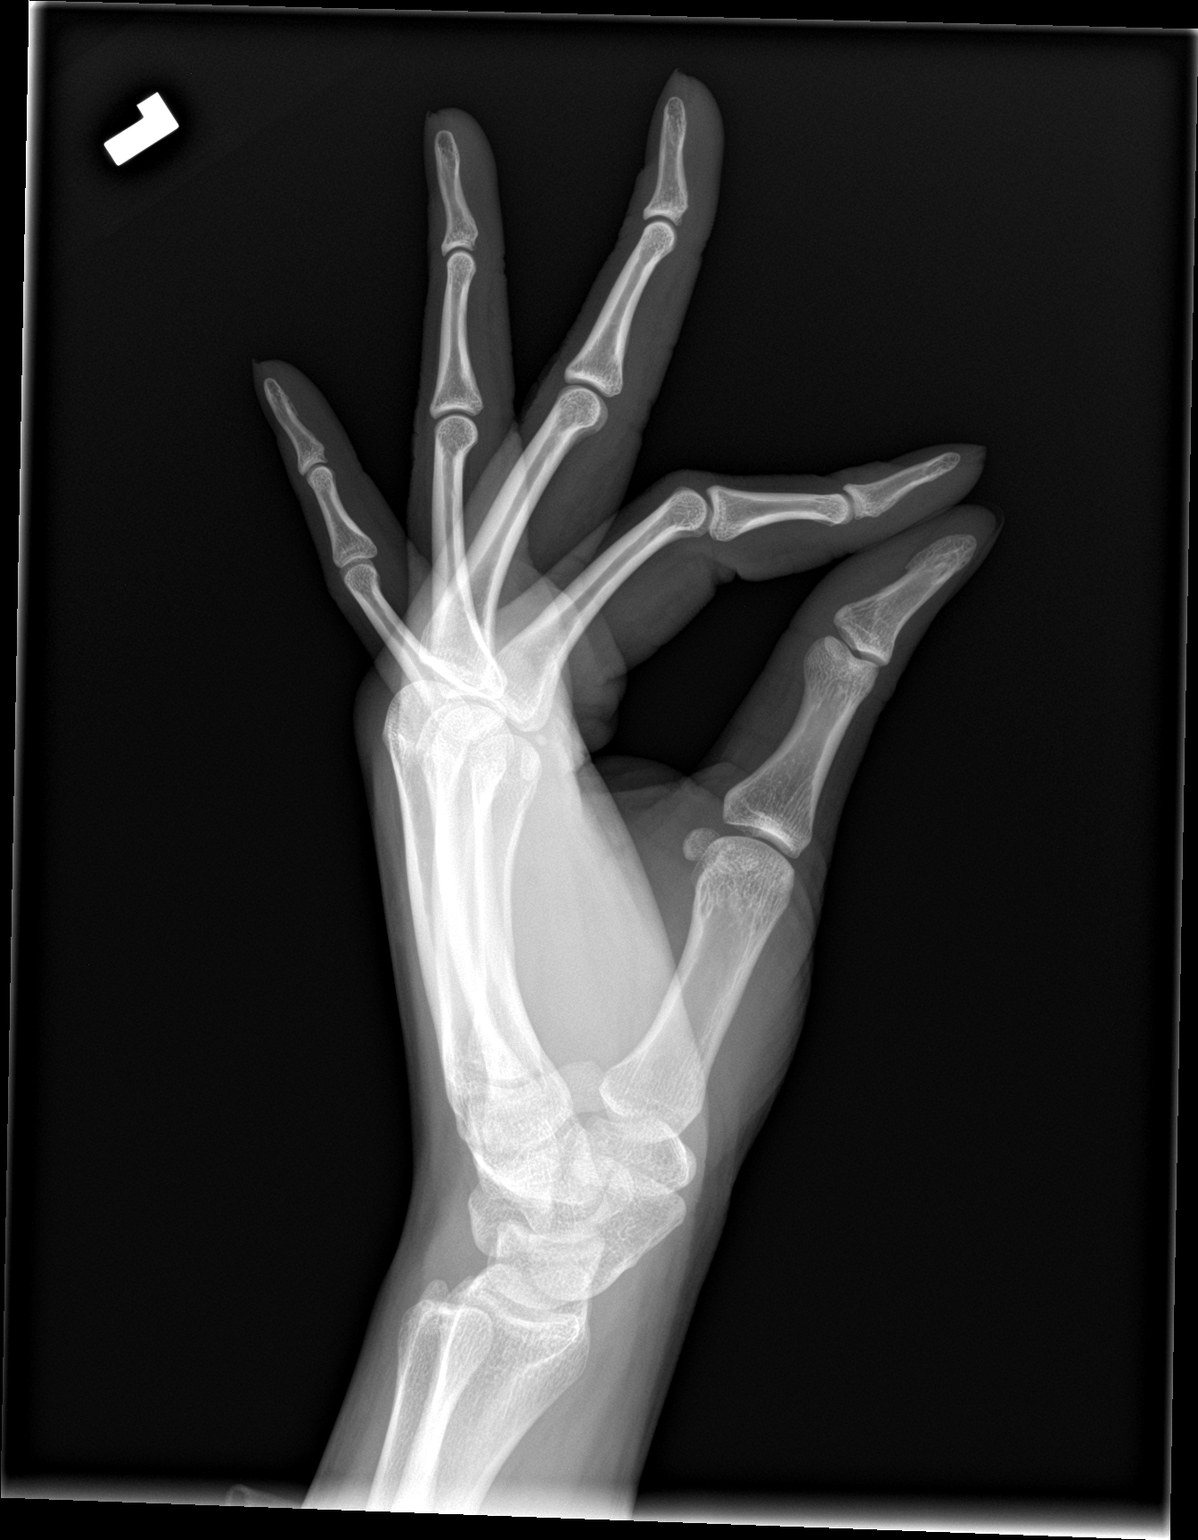

[3 of 3 positions shown; findings below may reference images not displayed]

FINDINGS: There is no evidence of fracture or dislocation. There is no
evidence of arthropathy or other focal bone abnormality. Soft
tissues are unremarkable.
IMPRESSION: Normal examination.
